# Patient Record
Sex: Female | Born: 1945 | Race: White | Hispanic: No | Marital: Single | Smoking: Never smoker
Health system: Southern US, Community
[De-identification: ages and names within clinical notes are randomized; demographics above are authoritative.]

## PROBLEM LIST (undated history)

## (undated) DIAGNOSIS — R519 Headache, unspecified: Secondary | ICD-10-CM

## (undated) DIAGNOSIS — R51 Headache: Secondary | ICD-10-CM

## (undated) DIAGNOSIS — K219 Gastro-esophageal reflux disease without esophagitis: Secondary | ICD-10-CM

## (undated) DIAGNOSIS — Z8719 Personal history of other diseases of the digestive system: Secondary | ICD-10-CM

## (undated) DIAGNOSIS — M1990 Unspecified osteoarthritis, unspecified site: Secondary | ICD-10-CM

## (undated) HISTORY — PX: TONSILLECTOMY: SUR1361

## (undated) HISTORY — PX: EYE SURGERY: SHX253

## (undated) HISTORY — PX: CHOLECYSTECTOMY: SHX55

---

## 2011-02-18 LAB — HM MAMMOGRAPHY

## 2011-05-09 ENCOUNTER — Encounter: Payer: Self-pay | Admitting: Family Medicine

## 2014-06-02 ENCOUNTER — Other Ambulatory Visit (INDEPENDENT_AMBULATORY_CARE_PROVIDER_SITE_OTHER): Payer: Self-pay | Admitting: Otolaryngology

## 2014-06-02 DIAGNOSIS — J329 Chronic sinusitis, unspecified: Secondary | ICD-10-CM

## 2014-06-09 ENCOUNTER — Ambulatory Visit
Admission: RE | Admit: 2014-06-09 | Discharge: 2014-06-09 | Disposition: A | Discharge status: Home / Self Care | Payer: Medicare Other | Source: Ambulatory Visit | Attending: Otolaryngology | Admitting: Otolaryngology

## 2014-06-09 DIAGNOSIS — J329 Chronic sinusitis, unspecified: Secondary | ICD-10-CM

## 2015-03-20 ENCOUNTER — Other Ambulatory Visit: Payer: Self-pay | Admitting: Otolaryngology

## 2015-04-18 ENCOUNTER — Encounter (HOSPITAL_BASED_OUTPATIENT_CLINIC_OR_DEPARTMENT_OTHER): Payer: Self-pay

## 2015-04-23 ENCOUNTER — Ambulatory Visit (HOSPITAL_BASED_OUTPATIENT_CLINIC_OR_DEPARTMENT_OTHER): Payer: Medicare Other | Admitting: Anesthesiology

## 2015-04-23 ENCOUNTER — Encounter (HOSPITAL_BASED_OUTPATIENT_CLINIC_OR_DEPARTMENT_OTHER)
Admission: RE | Disposition: A | Discharge status: Home / Self Care | Payer: Self-pay | Source: Ambulatory Visit | Attending: Otolaryngology

## 2015-04-23 ENCOUNTER — Encounter (HOSPITAL_BASED_OUTPATIENT_CLINIC_OR_DEPARTMENT_OTHER): Payer: Self-pay

## 2015-04-23 ENCOUNTER — Ambulatory Visit (HOSPITAL_BASED_OUTPATIENT_CLINIC_OR_DEPARTMENT_OTHER)
Admission: RE | Admit: 2015-04-23 | Discharge: 2015-04-23 | Disposition: A | Discharge status: Home / Self Care | Payer: Medicare Other | Source: Ambulatory Visit | Attending: Otolaryngology | Admitting: Otolaryngology

## 2015-04-23 DIAGNOSIS — J3489 Other specified disorders of nose and nasal sinuses: Secondary | ICD-10-CM | POA: Diagnosis present

## 2015-04-23 DIAGNOSIS — J342 Deviated nasal septum: Secondary | ICD-10-CM | POA: Diagnosis not present

## 2015-04-23 DIAGNOSIS — J343 Hypertrophy of nasal turbinates: Secondary | ICD-10-CM | POA: Diagnosis not present

## 2015-04-23 DIAGNOSIS — K219 Gastro-esophageal reflux disease without esophagitis: Secondary | ICD-10-CM | POA: Insufficient documentation

## 2015-04-23 HISTORY — DX: Gastro-esophageal reflux disease without esophagitis: K21.9

## 2015-04-23 HISTORY — DX: Headache, unspecified: R51.9

## 2015-04-23 HISTORY — DX: Personal history of other diseases of the digestive system: Z87.19

## 2015-04-23 HISTORY — PX: NASAL SEPTOPLASTY W/ TURBINOPLASTY: SHX2070

## 2015-04-23 HISTORY — DX: Headache: R51

## 2015-04-23 HISTORY — DX: Unspecified osteoarthritis, unspecified site: M19.90

## 2015-04-23 LAB — POCT HEMOGLOBIN-HEMACUE: Hemoglobin: 15.6 g/dL — ABNORMAL HIGH (ref 12.0–15.0)

## 2015-04-23 SURGERY — SEPTOPLASTY, NOSE, WITH NASAL TURBINATE REDUCTION
Anesthesia: General | Site: Nose | Laterality: Bilateral

## 2015-04-23 MED ORDER — FENTANYL CITRATE (PF) 100 MCG/2ML IJ SOLN
INTRAMUSCULAR | Status: AC
  Filled 2015-04-23: qty 2

## 2015-04-23 MED ORDER — FENTANYL CITRATE (PF) 100 MCG/2ML IJ SOLN
INTRAMUSCULAR | Status: AC
  Filled 2015-04-23: qty 6

## 2015-04-23 MED ORDER — SUCCINYLCHOLINE CHLORIDE 20 MG/ML IJ SOLN
INTRAMUSCULAR | Status: DC | PRN
  Administered 2015-04-23: 100 mg via INTRAVENOUS

## 2015-04-23 MED ORDER — GLYCOPYRROLATE 0.2 MG/ML IJ SOLN: 0.2000 mg | Freq: Once | INTRAMUSCULAR | Status: DC | PRN

## 2015-04-23 MED ORDER — FENTANYL CITRATE (PF) 100 MCG/2ML IJ SOLN
25.0000 ug | INTRAMUSCULAR | Status: DC | PRN
Start: 2015-04-23 — End: 2015-04-23
  Administered 2015-04-23 (×2): 25 ug via INTRAVENOUS

## 2015-04-23 MED ORDER — AMOXICILLIN 875 MG PO TABS: 875.0000 mg | ORAL_TABLET | Freq: Two times a day (BID) | ORAL | Status: DC

## 2015-04-23 MED ORDER — FENTANYL CITRATE (PF) 100 MCG/2ML IJ SOLN
50.0000 ug | INTRAMUSCULAR | Status: DC | PRN
  Administered 2015-04-23: 100 ug via INTRAVENOUS

## 2015-04-23 MED ORDER — OXYCODONE-ACETAMINOPHEN 5-325 MG PO TABS: 1.0000 | ORAL_TABLET | ORAL | Status: DC | PRN

## 2015-04-23 MED ORDER — EPHEDRINE SULFATE 50 MG/ML IJ SOLN
INTRAMUSCULAR | Status: DC | PRN
  Administered 2015-04-23: 10 mg via INTRAVENOUS

## 2015-04-23 MED ORDER — OXYMETAZOLINE HCL 0.05 % NA SOLN
NASAL | Status: DC | PRN
  Administered 2015-04-23: 1

## 2015-04-23 MED ORDER — MIDAZOLAM HCL 2 MG/2ML IJ SOLN
1.0000 mg | INTRAMUSCULAR | Status: DC | PRN
  Administered 2015-04-23: 2 mg via INTRAVENOUS

## 2015-04-23 MED ORDER — LIDOCAINE HCL (CARDIAC) 20 MG/ML IV SOLN
INTRAVENOUS | Status: DC | PRN
  Administered 2015-04-23: 70 mg via INTRAVENOUS

## 2015-04-23 MED ORDER — LACTATED RINGERS IV SOLN
INTRAVENOUS | Status: DC
  Administered 2015-04-23 (×2): via INTRAVENOUS

## 2015-04-23 MED ORDER — DEXAMETHASONE SODIUM PHOSPHATE 4 MG/ML IJ SOLN
INTRAMUSCULAR | Status: DC | PRN
  Administered 2015-04-23: 10 mg via INTRAVENOUS

## 2015-04-23 MED ORDER — PROPOFOL 10 MG/ML IV BOLUS
INTRAVENOUS | Status: DC | PRN
  Administered 2015-04-23: 160 mg via INTRAVENOUS

## 2015-04-23 MED ORDER — MIDAZOLAM HCL 2 MG/2ML IJ SOLN
INTRAMUSCULAR | Status: AC
  Filled 2015-04-23: qty 2

## 2015-04-23 MED ORDER — SCOPOLAMINE 1 MG/3DAYS TD PT72: 1.0000 | MEDICATED_PATCH | Freq: Once | TRANSDERMAL | Status: DC | PRN

## 2015-04-23 MED ORDER — MUPIROCIN 2 % EX OINT
TOPICAL_OINTMENT | CUTANEOUS | Status: DC | PRN
  Administered 2015-04-23: 1 via NASAL

## 2015-04-23 MED ORDER — ONDANSETRON HCL 4 MG/2ML IJ SOLN
INTRAMUSCULAR | Status: DC | PRN
  Administered 2015-04-23: 4 mg via INTRAVENOUS

## 2015-04-23 MED ORDER — LIDOCAINE-EPINEPHRINE 1 %-1:100000 IJ SOLN
INTRAMUSCULAR | Status: DC | PRN
  Administered 2015-04-23: 4 mL

## 2015-04-23 MED ORDER — CEFAZOLIN SODIUM-DEXTROSE 2-3 GM-% IV SOLR
INTRAVENOUS | Status: AC
  Filled 2015-04-23: qty 50

## 2015-04-23 MED ORDER — CEFAZOLIN SODIUM-DEXTROSE 2-3 GM-% IV SOLR
INTRAVENOUS | Status: DC | PRN
  Administered 2015-04-23: 2 g via INTRAVENOUS

## 2015-04-23 SURGICAL SUPPLY — 35 items
ATTRACTOMAT 16X20 MAGNETIC DRP (DRAPES) IMPLANT
CANISTER SUCT 1200ML W/VALVE (MISCELLANEOUS) ×3 IMPLANT
COAGULATOR SUCT 8FR VV (MISCELLANEOUS) ×3 IMPLANT
DECANTER SPIKE VIAL GLASS SM (MISCELLANEOUS) IMPLANT
DRSG NASOPORE 8CM (GAUZE/BANDAGES/DRESSINGS) IMPLANT
DRSG TELFA 3X8 NADH (GAUZE/BANDAGES/DRESSINGS) IMPLANT
ELECT REM PT RETURN 9FT ADLT (ELECTROSURGICAL) ×3
ELECTRODE REM PT RTRN 9FT ADLT (ELECTROSURGICAL) ×1 IMPLANT
GLOVE BIO SURGEON STRL SZ7.5 (GLOVE) ×3 IMPLANT
GLOVE BIOGEL PI IND STRL 7.0 (GLOVE) IMPLANT
GLOVE BIOGEL PI INDICATOR 7.0 (GLOVE) ×2
GLOVE ECLIPSE 6.5 STRL STRAW (GLOVE) ×2 IMPLANT
GOWN STRL REUS W/ TWL LRG LVL3 (GOWN DISPOSABLE) ×2 IMPLANT
GOWN STRL REUS W/TWL LRG LVL3 (GOWN DISPOSABLE) ×6
NDL HYPO 25X1 1.5 SAFETY (NEEDLE) ×1 IMPLANT
NEEDLE HYPO 25X1 1.5 SAFETY (NEEDLE) ×3 IMPLANT
NS IRRIG 1000ML POUR BTL (IV SOLUTION) ×3 IMPLANT
PACK BASIN DAY SURGERY FS (CUSTOM PROCEDURE TRAY) ×3 IMPLANT
PACK ENT DAY SURGERY (CUSTOM PROCEDURE TRAY) ×3 IMPLANT
PAD DRESSING TELFA 3X8 NADH (GAUZE/BANDAGES/DRESSINGS) IMPLANT
SLEEVE SCD COMPRESS KNEE MED (MISCELLANEOUS) IMPLANT
SOLUTION BUTLER CLEAR DIP (MISCELLANEOUS) ×3 IMPLANT
SPLINT NASAL AIRWAY SILICONE (MISCELLANEOUS) IMPLANT
SPONGE GAUZE 2X2 8PLY STER LF (GAUZE/BANDAGES/DRESSINGS) ×1
SPONGE GAUZE 2X2 8PLY STRL LF (GAUZE/BANDAGES/DRESSINGS) ×2 IMPLANT
SPONGE NEURO XRAY DETECT 1X3 (DISPOSABLE) ×3 IMPLANT
SUT CHROMIC 4 0 P 3 18 (SUTURE) ×3 IMPLANT
SUT PLAIN 4 0 ~~LOC~~ 1 (SUTURE) ×3 IMPLANT
SUT PROLENE 3 0 PS 2 (SUTURE) ×3 IMPLANT
SUT VIC AB 4-0 P-3 18XBRD (SUTURE) IMPLANT
SUT VIC AB 4-0 P3 18 (SUTURE)
TOWEL OR 17X24 6PK STRL BLUE (TOWEL DISPOSABLE) ×3 IMPLANT
TUBE SALEM SUMP 12R W/ARV (TUBING) IMPLANT
TUBE SALEM SUMP 16 FR W/ARV (TUBING) ×3 IMPLANT
YANKAUER SUCT BULB TIP NO VENT (SUCTIONS) ×3 IMPLANT

## 2015-04-23 NOTE — Transfer of Care (Signed)
Immediate Anesthesia Transfer of Care Note  Patient: Kayla Peters  Procedure(s) Performed: Procedure(s): NASAL SEPTOPLASTY WITH BILATERAL TURBINATE REDUCTION (Bilateral)  Patient Location: PACU  Anesthesia Type:General  Level of Consciousness: awake, alert  and oriented  Airway & Oxygen Therapy: Patient Spontanous Breathing and Patient connected to face mask oxygen  Post-op Assessment: Report given to RN and Post -op Vital signs reviewed and stable  Post vital signs: Reviewed and stable  Last Vitals:  Filed Vitals:   04/23/15 0804  BP: 155/80  Pulse: 90  Temp: 36.4 C  Resp: 18    Complications: No apparent anesthesia complications

## 2015-04-23 NOTE — H&P (Signed)
Cc: Chronic nasal obstruction  HPI: The patient is a 69 year old female who presents today for her follow-up evaluation. She was last seen 3 months ago. At that time, she was noted to have bilateral nasal mucosal congestion, septal deviation, and bilateral inferior turbinate hypertrophy. Her previous sinus CT was consistent with bilateral chronic maxillary sinusitis. The patient was encouraged to continue to perform nasal saline irrigation on a daily basis and continue the use of a steroid nasal spray. The patient returns today noting continued nasal congestion. She discontinued her steroid nasal spray secondary to headaches. She currently denies any facial pain or pressure. She denies any fever, visual change, or purulent drainage. No other ENT, GI, or respiratory issue noted since the last visit.   Exam General: Communicates without difficulty, well nourished, no acute distress. Head: Normocephalic, no evidence injury, no tenderness, facial buttresses intact without stepoff. Eyes: PERRL, EOMI. No scleral icterus, conjunctivae clear. Neuro: CN II exam reveals vision grossly intact. No nystagmus at any point of gaze. Ears: Auricles well formed without lesions. Ear canals are intact without mass or lesion. No erythema or edema is appreciated. The TMs are intact without fluid. Nose: External evaluation reveals normal support and skin without lesions. Dorsum is intact. Anterior rhinoscopy reveals congested and edematous mucosa over anterior aspect of the inferior turbinates and nasal septum. No purulence is noted. NSD. Oral:  Oral cavity and oropharynx are intact, symmetric, without erythema or edema. Mucosa is moist without lesions. Neck: Full range of motion without pain. There is no significant lymphadenopathy. No masses palpable. Thyroid bed within normal limits to palpation. Parotid glands and submandibular glands equal bilaterally without mass. Trachea is midline. Neuro:  CN 2-12 grossly intact. Gait  normal.  Assessment 1. Persistent nasal mucosal congestion with bilateral inferior turbinate hypertrophy and nasal septal deviation without evidence of acute sinus infection.  Plan  1. The physical exam findings and treatment options are again discussed with the patient. 2. The option of septoplasty and turbinate reduction to improve her nasal passageway is extensively discussed. The risks, benefits and details of the procedure are also reviewed. The patient would like to continue with medical management at this time. 3.  The patient would like to proceed with the procedures.

## 2015-04-23 NOTE — Op Note (Signed)
DATE OF PROCEDURE: 04/23/2015  OPERATIVE REPORT   SURGEON: Newman Pies, MD   PREOPERATIVE DIAGNOSES:  1. Severe nasal septal deviation.  2. Bilateral inferior turbinate hypertrophy.  3. Chronic nasal obstruction.  POSTOPERATIVE DIAGNOSES:  1. Severe nasal septal deviation.  2. Bilateral inferior turbinate hypertrophy.  3. Chronic nasal obstruction.  PROCEDURE PERFORMED:  1. Septoplasty.  2. Bilateral partial inferior turbinate resection.   ANESTHESIA: General endotracheal tube anesthesia.   COMPLICATIONS: None.   ESTIMATED BLOOD LOSS: Less than 50 mL.   INDICATION FOR PROCEDURE: Kayla Peters is a 69 y.o. female with a history of chronic nasal obstruction. The patient was  treated with  antihistamine, decongestant, steroid nasal spray, and systemic steroids. However, the patient continues to be symptomatic. On examination, the patient was noted to have bilateral severe inferior turbinate hypertrophy and significant nasal septal deviation, causing significant nasal obstruction. Based on the above findings, the decision was made for the patient to undergo the above-stated procedure. The risks, benefits, alternatives, and details of the procedure were discussed with the patient. Questions were invited and answered. Informed consent was obtained.   DESCRIPTION OF PROCEDURE: The patient was taken to the operating room and placed supine on the operating table. General endotracheal tube anesthesia was administered by the anesthesiologist. The patient was positioned, and prepped and draped in the standard fashion for nasal surgery. Pledgets soaked with Afrin were placed in both nasal cavities for decongestion. The pledgets were subsequently removed. The above mentioned severe septal deviation was again noted. 1% lidocaine with 1:100,000 epinephrine was injected onto the nasal septum bilaterally. A hemitransfixion incision was made on the left side. The mucosal flap was carefully elevated on the left  side. A cartilaginous incision was made 1 cm superior to the caudal margin of the nasal septum. Mucosal flap was also elevated on the right side in the similar fashion. It should be noted that due to the severe septal deviation, the deviated portion of the cartilaginous and bony septum had to be removed in piecemeal fashion. Once the deviated portions were removed, a straight midline septum was achieved. The septum was then quilted with 4-0 plain gut sutures. The hemitransfixion incision was closed with interrupted 4-0 chromic sutures. Doyle splints were applied.   Prior to the [facility] splint application, the inferior one half of both hypertrophied inferior turbinate was crossclamped with a Kelly clamp. The inferior one half of each inferior turbinate was then resected with a pair of cross cutting scissors. Hemostasis was achieved with a suction cautery device.   The care of the patient was turned over to the anesthesiologist. The patient was awakened from anesthesia without difficulty. The patient was extubated and transferred to the recovery room in good condition.   OPERATIVE FINDINGS: Severe nasal septal deviation and bilateral inferior turbinate hypertrophy.   SPECIMEN: None.   FOLLOWUP CARE: The patient be discharged home once she is awake and alert. The patient will be placed on Percocet 1-2 tablets p.o. q.6 hours p.r.n. pain, and amoxicillin 875 mg p.o. b.i.d. for 5 days. The patient will follow up in my office in approximately 1 week for splint removal.   Dashonda Bonneau Philomena Doheny, MD

## 2015-04-23 NOTE — Anesthesia Procedure Notes (Signed)
Procedure Name: Intubation Date/Time: 04/23/2015 9:42 AM Performed by: Burna CashONRAD, Kani Chauvin C Pre-anesthesia Checklist: Patient identified, Emergency Drugs available, Suction available and Patient being monitored Patient Re-evaluated:Patient Re-evaluated prior to inductionOxygen Delivery Method: Circle System Utilized Preoxygenation: Pre-oxygenation with 100% oxygen Intubation Type: IV induction Ventilation: Mask ventilation without difficulty Laryngoscope Size: Mac and 3 Grade View: Grade I Tube type: Oral Tube size: 7.0 mm Number of attempts: 1 Airway Equipment and Method: Stylet and Oral airway Placement Confirmation: ETT inserted through vocal cords under direct vision,  positive ETCO2 and breath sounds checked- equal and bilateral Secured at: 21 cm Tube secured with: Tape Dental Injury: Teeth and Oropharynx as per pre-operative assessment

## 2015-04-23 NOTE — Anesthesia Postprocedure Evaluation (Signed)
  Anesthesia Post-op Note  Patient: Kayla Peters  Procedure(s) Performed: Procedure(s): NASAL SEPTOPLASTY WITH BILATERAL TURBINATE REDUCTION (Bilateral)  Patient Location: PACU  Anesthesia Type:General  Level of Consciousness: awake  Airway and Oxygen Therapy: Patient Spontanous Breathing  Post-op Pain: mild  Post-op Assessment: Post-op Vital signs reviewed              Post-op Vital Signs: Reviewed  Last Vitals:  Filed Vitals:   04/23/15 1212  BP:   Pulse: 83  Temp: 36.5 C  Resp: 18    Complications: No apparent anesthesia complications

## 2015-04-23 NOTE — Anesthesia Preprocedure Evaluation (Addendum)
Anesthesia Evaluation  Patient identified by MRN, date of birth, ID band Patient awake    Airway Mallampati: II  TM Distance: >3 FB Neck ROM: Full    Dental   Pulmonary neg pulmonary ROS,  breath sounds clear to auscultation        Cardiovascular negative cardio ROS  Rhythm:Regular Rate:Normal     Neuro/Psych    GI/Hepatic Neg liver ROS, hiatal hernia, GERD-  ,  Endo/Other    Renal/GU negative Renal ROS     Musculoskeletal   Abdominal   Peds  Hematology   Anesthesia Other Findings   Reproductive/Obstetrics                            Anesthesia Physical Anesthesia Plan  ASA: II  Anesthesia Plan: General   Post-op Pain Management:    Induction: Intravenous  Airway Management Planned: Oral ETT  Additional Equipment:   Intra-op Plan:   Post-operative Plan: Extubation in OR  Informed Consent: I have reviewed the patients History and Physical, chart, labs and discussed the procedure including the risks, benefits and alternatives for the proposed anesthesia with the patient or authorized representative who has indicated his/her understanding and acceptance.   Dental advisory given  Plan Discussed with: CRNA and Anesthesiologist  Anesthesia Plan Comments:         Anesthesia Quick Evaluation

## 2015-04-23 NOTE — Discharge Instructions (Addendum)

## 2015-04-24 ENCOUNTER — Encounter (HOSPITAL_BASED_OUTPATIENT_CLINIC_OR_DEPARTMENT_OTHER): Payer: Self-pay | Admitting: Otolaryngology

## 2019-11-16 ENCOUNTER — Ambulatory Visit: Payer: Medicare Other | Attending: Internal Medicine

## 2019-11-16 DIAGNOSIS — Z23 Encounter for immunization: Secondary | ICD-10-CM

## 2019-11-16 NOTE — Progress Notes (Signed)
   Covid-19 Vaccination Clinic  Name:  Kayla Peters    MRN: [id number] DOB: 10/15/1946  11/16/2019  [name] was observed post Covid-19 immunization for 15 minutes without incidence. She was provided with Vaccine Information Sheet and instruction to access the V-Safe system.   [name] was instructed to call 911 with any severe reactions post vaccine: Marland Kitchen Difficulty breathing  . Swelling of your face and throat  . A fast heartbeat  . A bad rash all over your body  . Dizziness and weakness    Immunizations Administered    Name Date Dose VIS Date Route   Pfizer COVID-19 Vaccine 11/16/2019 12:40 PM 0.3 mL 10/07/2019 Intramuscular   Manufacturer: [employer], [employer]   Lot: [id number]   NDC: [id number]

## 2019-12-05 ENCOUNTER — Ambulatory Visit: Payer: Medicare Other | Attending: Internal Medicine

## 2019-12-05 DIAGNOSIS — Z23 Encounter for immunization: Secondary | ICD-10-CM | POA: Insufficient documentation

## 2019-12-05 NOTE — Progress Notes (Signed)
   Covid-19 Vaccination Clinic  Name:  Shaden Higley    MRN: [id number] DOB: 22-May-1946  12/05/2019  Ms. Courtright was observed post Covid-19 immunization for 15 minutes without incidence. She was provided with Vaccine Information Sheet and instruction to access the V-Safe system.   Ms. Poppen was instructed to call 911 with any severe reactions post vaccine: Marland Kitchen Difficulty breathing  . Swelling of your face and throat  . A fast heartbeat  . A bad rash all over your body  . Dizziness and weakness    Immunizations Administered    Name Date Dose VIS Date Route   Pfizer COVID-19 Vaccine 12/05/2019  9:03 AM 0.3 mL 10/07/2019 Intramuscular   Manufacturer: [employer], [employer]   Lot: [id number]   NDC: [address]-8

## 2019-12-23 ENCOUNTER — Ambulatory Visit: Payer: Medicare Other

## 2020-01-02 ENCOUNTER — Ambulatory Visit (INDEPENDENT_AMBULATORY_CARE_PROVIDER_SITE_OTHER): Payer: Medicare Other | Admitting: Sports Medicine

## 2020-01-02 ENCOUNTER — Other Ambulatory Visit: Payer: Self-pay

## 2020-01-02 ENCOUNTER — Ambulatory Visit (INDEPENDENT_AMBULATORY_CARE_PROVIDER_SITE_OTHER): Payer: Medicare Other

## 2020-01-02 ENCOUNTER — Encounter: Payer: Self-pay | Admitting: Sports Medicine

## 2020-01-02 DIAGNOSIS — M25572 Pain in left ankle and joints of left foot: Secondary | ICD-10-CM

## 2020-01-02 DIAGNOSIS — G8929 Other chronic pain: Secondary | ICD-10-CM | POA: Insufficient documentation

## 2020-01-02 DIAGNOSIS — L84 Corns and callosities: Secondary | ICD-10-CM | POA: Diagnosis not present

## 2020-01-02 DIAGNOSIS — L600 Ingrowing nail: Secondary | ICD-10-CM | POA: Diagnosis not present

## 2020-01-02 MED ORDER — CELECOXIB 100 MG PO CAPS: ORAL_CAPSULE | ORAL | 6 refills | Status: AC

## 2020-01-02 MED ORDER — DOXYCYCLINE HYCLATE 100 MG PO TABS: 100.0000 mg | ORAL_TABLET | Freq: Two times a day (BID) | ORAL | 0 refills | Status: AC

## 2020-01-02 NOTE — Progress Notes (Addendum)
    Procedures performed today:    None.  Independent interpretation of notes and tests performed by another provider:   None.  Impression and Recommendations:    Corn of left foot Kayla Peters does have 2 corns on the plantar aspect of her left fifth MTP. The proximal corn is the most painful. At a future visit she will come back for full surgical excision, she has had a few shaving procedures with podiatry without much improvement.  Chronic pain of left ankle For years this pleasant 74 year old female has had pain all around her talar dome, left ankle, moderate, persistent. 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  Ingrown toenail of right foot Kayla Peters had a medial nail plate excision, lateral nail plate excision sometime ago with a podiatrist. Unfortunately she has had chronic persistent pain with mild drainage, erythema. On exam there is a shard of nail that seems to be digging into her medial nail fold. It has also developed into a paronychia. Adding doxycycline, Celebrex. She will return on Friday for medial and lateral nail plate excision's with phenol matricectomy.    ___________________________________________ Ihor Austin. Benjamin Stain, M.D., ABFM., CAQSM. Primary Care and Sports Medicine [facility]  Adjunct Instructor of Family Medicine  [employer]

## 2020-01-02 NOTE — Assessment & Plan Note (Signed)
For years this pleasant 74 year old female has had pain all around her talar dome, left ankle, moderate, persistent. 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

## 2020-01-02 NOTE — Assessment & Plan Note (Addendum)
Kayla Peters does have 2 corns on the plantar aspect of her left fifth MTP. The proximal corn is the most painful. At a future visit she will come back for full surgical excision, she has had a few shaving procedures with podiatry without much improvement.

## 2020-01-02 NOTE — Assessment & Plan Note (Addendum)
Kayla Peters had a medial nail plate excision, lateral nail plate excision sometime ago with a podiatrist. Unfortunately she has had chronic persistent pain with mild drainage, erythema. On exam there is a shard of nail that seems to be digging into her medial nail fold. It has also developed into a paronychia. Adding doxycycline, Celebrex. She will return on Friday for medial and lateral nail plate excision's with phenol matricectomy.

## 2020-01-06 ENCOUNTER — Other Ambulatory Visit: Payer: Self-pay

## 2020-01-06 ENCOUNTER — Ambulatory Visit (INDEPENDENT_AMBULATORY_CARE_PROVIDER_SITE_OTHER): Payer: Medicare Other | Admitting: Sports Medicine

## 2020-01-06 DIAGNOSIS — L600 Ingrowing nail: Secondary | ICD-10-CM | POA: Diagnosis not present

## 2020-01-06 MED ORDER — TERBINAFINE HCL 250 MG PO TABS: 250.0000 mg | ORAL_TABLET | Freq: Every day | ORAL | 1 refills | Status: AC

## 2020-01-06 MED ORDER — HYDROCODONE-ACETAMINOPHEN 10-325 MG PO TABS: 1.0000 | ORAL_TABLET | Freq: Three times a day (TID) | ORAL | 0 refills | Status: DC | PRN

## 2020-01-06 NOTE — Assessment & Plan Note (Signed)
Kayla Peters returns, she had a medial nail plate excision last year sometime, unfortunately a shard of nail seems to be digging into her lateral nail fold of her right great toe. Today we are going to do a lateral nail plate excision with phenol matricectomy. I am also going to add terbinafine for 6 months with LFTs checked after 1 month. She did finish her course of doxycycline. Return to see me in 1 month for a wound check. She is going to think about what kind of pain medication she would like to have after the surgery.

## 2020-01-06 NOTE — Addendum Note (Signed)
Addended by: Monica Becton on: 01/06/2020 02:53 PM   Modules accepted: Orders

## 2020-01-06 NOTE — Progress Notes (Signed)
     Procedures performed today:    Procedure:  Removal of right great lateral nail plate with phenol matricectomy. Risks, benefits, alternatives explained to patient. Consent obtained. Time out conducted. Noted no overlying induration or erythema at site of injection. Toe cleaned with alcohol, then a total of 10cc lidocaine 1% infiltrated at adjacent webspaces at the location of the bifurcation of the common digital nerve to proper digital nerves.  Adequate anesthesia ensured. Toe prepped and draped in a sterile fashion. Nail elevator used to separate nail plate from nail bed. Clippers used to cut toenail in a longitudinal fashion to proximal nail fold and matrix. Hemostat then used to separate nail fragment from surrounding structures. Nail bed and matrix treated. Minor bleeding controlled with pressure and phenol. Antibiotic ointment applied. Toe dressed. Advised to return if increased redness, swelling, drainage, fevers, or chills.  Independent interpretation of notes and tests performed by another provider:   None.  Impression and Recommendations:    Ingrown toenail of right foot Kayla Peters returns, she had a medial nail plate excision last year sometime, unfortunately a shard of nail seems to be digging into her lateral nail fold of her right great toe. Today we are going to do a lateral nail plate excision with phenol matricectomy. I am also going to add terbinafine for 6 months with LFTs checked after 1 month. She did finish her course of doxycycline. Return to see me in 1 month for a wound check. She is going to think about what kind of pain medication she would like to have after the surgery.    ___________________________________________ Ihor Austin. Benjamin Stain, M.D., ABFM., CAQSM. Primary Care and Sports Medicine [facility]  Adjunct Instructor of Family Medicine  [employer]

## 2020-01-06 NOTE — Patient Instructions (Signed)
Fingernail or Toenail Removal, Adult, Care After °This sheet gives you information about how to care for yourself after your procedure. Your health care provider may also give you more specific instructions. If you have problems or questions, contact your health care provider. °What can I expect after the procedure? °After the procedure, it is common to have: °· Pain. °· Redness. °· Swelling. °· Soreness. °Follow these instructions at home: °Medicines °· Take over-the-counter and prescription medicines only as told by your health care provider. °· If you were prescribed an antibiotic medicine, take or apply it as told by your health care provider. Do not stop using the antibiotic even if you start to feel better. °Wound care °· Follow instructions from your health care provider about how to take care of your wound. Make sure you: °? Wash your hands with soap and water for at least 20 seconds before and after you change your bandage (dressing). If soap and water are not available, use hand sanitizer. °? Change your dressing as told by your health care provider. °? Keep your dressing dry until your health care provider says it can be removed. °· Check your wound every day for signs of infection. Check for: °? More redness, swelling, or pain. °? More fluid or blood. °? Warmth. °? Pus or a bad smell. °If you have a splint: ° °· Wear the splint as told by your health care provider. Remove it only as told by your health care provider. °· Loosen the splint if your fingers tingle, become numb, or turn cold and blue. °· Keep the splint clean. °· If the splint is not waterproof: °? Do not let it get wet. °? Cover it with a watertight covering when you take a bath or a shower. °Managing pain, stiffness, and swelling °· Move your fingers or toes often to reduce stiffness and swelling. °· Raise (elevate) the injured area above the level of your heart while you are sitting or lying down. You may need to keep your hand or foot  raised or supported on a pillow for 24 hours or as told by your health care provider. °General instructions °· If you were given a shoe to wear, wear it as told by your health care provider. °· Keep all follow-up visits as told by your health care provider. This is important. °Contact a health care provider if: °· You have more redness, swelling, or pain around your wound. °· You have more fluid or blood coming from your wound. °· You have pus or a bad smell coming from your wound. °· Your wound feels warm to the touch. °· You have a fever. °Get help right away if: °· Your finger or toe looks pale, blue, or black. °· You are not able to move your finger or toe. °Summary °· After the procedure, it is common to have pain and swelling. °· Keep the hand or foot raised (elevated) or supported on a pillow as told by your health care provider. °· Take over-the-counter and prescription medicines only as told by your health care provider. °· Check your wound every day for signs of infection. °This information is not intended to replace advice given to you by your health care provider. Make sure you discuss any questions you have with your health care provider. °Document Revised: 06/06/2019 Document Reviewed: 06/06/2019 °Elsevier Patient Education © 2020 [employer] Inc[employer] ° °

## 2020-02-03 ENCOUNTER — Ambulatory Visit (INDEPENDENT_AMBULATORY_CARE_PROVIDER_SITE_OTHER): Payer: Medicare Other | Admitting: Sports Medicine

## 2020-02-03 ENCOUNTER — Other Ambulatory Visit: Payer: Self-pay

## 2020-02-03 DIAGNOSIS — L84 Corns and callosities: Secondary | ICD-10-CM | POA: Diagnosis not present

## 2020-02-03 DIAGNOSIS — L600 Ingrowing nail: Secondary | ICD-10-CM

## 2020-02-03 NOTE — Progress Notes (Signed)
    Procedures performed today:    None.  Independent interpretation of notes and tests performed by another provider:   None.  Brief History, Exam, Impression, and Recommendations:    Ingrown toenail of right foot This pleasant 74 year old female returns, 1 month ago I performed a right great lateral nail plate excision. She is doing extremely well.  Corn of left foot Normal does have a couple of corns on the plantar aspect of her left fifth MTP and proximally over the fifth metatarsal. The proximal corn is the most painful, she will make an appointment for a 30-minute full surgical excision, she declines any sedation prior.     ___________________________________________ Ihor Austin. Benjamin Stain, M.D., ABFM., CAQSM. Primary Care and Sports Medicine [facility]  Adjunct Instructor of Family Medicine  [employer]

## 2020-02-03 NOTE — Assessment & Plan Note (Signed)
Normal does have a couple of corns on the plantar aspect of her left fifth MTP and proximally over the fifth metatarsal. The proximal corn is the most painful, she will make an appointment for a 30-minute full surgical excision, she declines any sedation prior.

## 2020-02-03 NOTE — Assessment & Plan Note (Signed)
This pleasant 74 year old female returns, 1 month ago I performed a right great lateral nail plate excision. She is doing extremely well.

## 2020-02-08 ENCOUNTER — Encounter: Payer: Self-pay | Admitting: Sports Medicine

## 2020-02-08 ENCOUNTER — Ambulatory Visit (INDEPENDENT_AMBULATORY_CARE_PROVIDER_SITE_OTHER): Payer: Medicare Other | Admitting: Sports Medicine

## 2020-02-08 ENCOUNTER — Other Ambulatory Visit: Payer: Self-pay

## 2020-02-08 DIAGNOSIS — L84 Corns and callosities: Secondary | ICD-10-CM

## 2020-02-08 NOTE — Assessment & Plan Note (Signed)
Kayla Peters returns, she has 2 corns on the plantar aspect of her left foot, around the MTP. I excised both of these today under local anesthesia. They were both closed with interrupted sutures, she will return to see me in 2 weeks for suture removal. She will wear a postop shoe until then. She does have oxycodone at home for pain.

## 2020-02-08 NOTE — Patient Instructions (Signed)
Incision Care, Adult An incision is a surgical cut that is made through your skin. Most incisions are closed after surgery. Your incision may be closed with stitches (sutures), staples, skin glue, or adhesive strips. You may need to return to your health care provider to have sutures or staples removed. This may occur several days to several weeks after your surgery. The incision needs to be cared for properly to prevent infection. How to care for your incision Incision care   Follow instructions from your health care provider about how to take care of your incision. Make sure you: ? Wash your hands with soap and water before you change the bandage (dressing). If soap and water are not available, use hand sanitizer. ? Change your dressing as told by your health care provider. ? Leave sutures, skin glue, or adhesive strips in place. These skin closures may need to stay in place for 2 weeks or longer. If adhesive strip edges start to loosen and curl up, you may trim the loose edges. Do not remove adhesive strips completely unless your health care provider tells you to do that.  Check your incision area every day for signs of infection. Check for: ? More redness, swelling, or pain. ? More fluid or blood. ? Warmth. ? Pus or a bad smell.  Ask your health care provider how to clean the incision. This may include: ? Using mild soap and water. ? Using a clean towel to pat the incision dry after cleaning it. ? Applying a cream or ointment. Do this only as told by your health care provider. ? Covering the incision with a clean dressing.  Ask your health care provider when you can leave the incision uncovered.  Do not take baths, swim, or use a hot tub until your health care provider approves. Ask your health care provider if you can take showers. You may only be allowed to take sponge baths for bathing. Medicines  If you were prescribed an antibiotic medicine, cream, or ointment, take or apply the  antibiotic as told by your health care provider. Do not stop taking or applying the antibiotic even if your condition improves.  Take over-the-counter and prescription medicines only as told by your health care provider. General instructions  Limit movement around your incision to improve healing. ? Avoid straining, lifting, or exercise for the first month, or for as long as told by your health care provider. ? Follow instructions from your health care provider about returning to your normal activities. ? Ask your health care provider what activities are safe.  Protect your incision from the sun when you are outside for the first 6 months, or for as long as told by your health care provider. Apply sunscreen around the scar or cover it up.  Keep all follow-up visits as told by your health care provider. This is important. Contact a health care provider if:  Your have more redness, swelling, or pain around the incision.  You have more fluid or blood coming from the incision.  Your incision feels warm to the touch.  You have pus or a bad smell coming from the incision.  You have a fever or shaking chills.  You are nauseous or you vomit.  You are dizzy.  Your sutures or staples come undone. Get help right away if:  You have a red streak coming from your incision.  Your incision bleeds through the dressing and the bleeding does not stop with gentle pressure.  The edges of   your incision open up and separate.  You have severe pain.  You have a rash.  You are confused.  You faint.  You have trouble breathing and a fast heartbeat. This information is not intended to replace advice given to you by your health care provider. Make sure you discuss any questions you have with your health care provider. Document Revised: 10/15/2018 Document Reviewed: 04/30/2016 Elsevier Patient Education  2020 [employer]   

## 2020-02-08 NOTE — Progress Notes (Signed)
    Procedures performed today:    Procedure:  Excision of first 1.1 cm corn on the plantar foot at the level of the fifth MTP Risks, benefits, and alternatives explained and consent obtained. Time out conducted. Surface prepped with alcohol. 1cc lidocaine with epinephine infiltrated in a field block. Adequate anesthesia ensured. Area prepped and draped in a sterile fashion. Excision performed with: I made an elliptical incision with a #11 blade, undermined the incision using blunt dissection to remove tension across the edges, removed the corn in its entirety and closed the incision with #3 Ethicon 3-0 silk sutures. Hemostasis achieved. Pt stable.  Procedure:  Excision of second 1.1 cm corn on the plantar foot at the level of the fifth MTP Risks, benefits, and alternatives explained and consent obtained. Time out conducted. Surface prepped with alcohol. 1cc lidocaine with epinephine infiltrated in a field block. Adequate anesthesia ensured. Area prepped and draped in a sterile fashion. Excision performed with: I made an elliptical incision with a #11 blade, undermined the incision, undermined the edges with blunt dissection to remove tension across the incision, and removed the corn in its entirety and closed the incision with #2 Ethicon 3-0 silk sutures. Hemostasis achieved. Pt stable.  Independent interpretation of notes and tests performed by another provider:   None.  Brief History, Exam, Impression, and Recommendations:    Corn of left foot Kayla Peters returns, she has 2 corns on the plantar aspect of her left foot, around the MTP. I excised both of these today under local anesthesia. They were both closed with interrupted sutures, she will return to see me in 2 weeks for suture removal. She will wear a postop shoe until then. She does have oxycodone at home for pain.    ___________________________________________ Ihor Austin. Benjamin Stain, M.D., ABFM., CAQSM. Primary Care and  Sports Medicine [place] [facility]  Adjunct Instructor of Family Medicine  [employer]

## 2020-02-22 ENCOUNTER — Other Ambulatory Visit: Payer: Self-pay

## 2020-02-22 ENCOUNTER — Ambulatory Visit (INDEPENDENT_AMBULATORY_CARE_PROVIDER_SITE_OTHER): Payer: Medicare Other | Admitting: Sports Medicine

## 2020-02-22 DIAGNOSIS — L84 Corns and callosities: Secondary | ICD-10-CM | POA: Diagnosis not present

## 2020-02-22 DIAGNOSIS — L600 Ingrowing nail: Secondary | ICD-10-CM

## 2020-02-22 NOTE — Assessment & Plan Note (Addendum)
Kayla Peters also had a right lateral great nail plate excision with phenol matricectomy, she continues to do extremely well, her nail looks great.  This was approximately 6 weeks ago. She is currently using Jublia, I think she can do this for another month but no further treatment will be necessary.

## 2020-02-22 NOTE — Progress Notes (Signed)
    Procedures performed today:    None.  Independent interpretation of notes and tests performed by another provider:   None.  Brief History, Exam, Impression, and Recommendations:    Corn of left foot Normal returns, I performed a surgical excision of 2 corns on the plantar aspect of her left foot 2 weeks ago, she returns today doing very well, the pain has improved considerably, she has a bit of incisional pain but the pain from the corns themselves is gone. Today I remove the sutures and applied some Dermabond to reinforce the incision, return as needed for this.  Ingrown toenail of right foot Shanelle also had a right lateral great nail plate excision with phenol matricectomy, she continues to do extremely well, her nail looks great.  This was approximately 6 weeks ago. She is currently using Jublia, I think she can do this for another month but no further treatment will be necessary.    ___________________________________________ Ihor Austin. Benjamin Stain, M.D., ABFM., CAQSM. Primary Care and Sports Medicine [place] [facility]  Adjunct Instructor of Family Medicine  [employer]

## 2020-02-22 NOTE — Assessment & Plan Note (Addendum)
Normal returns, I performed a surgical excision of 2 corns on the plantar aspect of her left foot 2 weeks ago, she returns today doing very well, the pain has improved considerably, she has a bit of incisional pain but the pain from the corns themselves is gone. Today I remove the sutures and applied some Dermabond to reinforce the incision, return as needed for this.

## 2022-01-23 ENCOUNTER — Ambulatory Visit (INDEPENDENT_AMBULATORY_CARE_PROVIDER_SITE_OTHER): Payer: Medicare Other | Admitting: Podiatry

## 2022-01-23 ENCOUNTER — Encounter: Payer: Self-pay | Admitting: Podiatry

## 2022-01-23 DIAGNOSIS — Q828 Other specified congenital malformations of skin: Secondary | ICD-10-CM | POA: Diagnosis not present

## 2022-01-23 DIAGNOSIS — L84 Corns and callosities: Secondary | ICD-10-CM | POA: Diagnosis not present

## 2022-01-23 DIAGNOSIS — M7752 Other enthesopathy of left foot: Secondary | ICD-10-CM | POA: Diagnosis not present

## 2022-01-23 MED ORDER — TRIAMCINOLONE ACETONIDE 10 MG/ML IJ SUSP
10.0000 mg | Freq: Once | INTRAMUSCULAR | Status: AC
  Administered 2022-01-23: 10 mg

## 2022-01-23 NOTE — Progress Notes (Signed)
Subjective:  ? ?Patient ID: Kayla Peters, female   DOB: 76 y.o.   MRN: [id number]  ? ?HPI ?Patient presents with chronic lesions underneath the feet bilateral that are painful when pressed and feels like she is walking on bone with inflammation fluid around the plantar aspect of the left fifth metatarsal.  Patient states they have been present for around 6 months and she is tried shoe gear modifications and smoothing them and does not smoke likes to be active ? ? ?Review of Systems  ?All other systems reviewed and are negative. ? ? ?   ?Objective:  ?Physical Exam ?Vitals and nursing note reviewed.  ?Constitutional:   ?   Appearance: She is well-developed.  ?Pulmonary:  ?   Effort: Pulmonary effort is normal.  ?Musculoskeletal:     ?   General: Normal range of motion.  ?Skin: ?   General: Skin is warm.  ?Neurological:  ?   Mental Status: She is alert.  ?  ?Neurovascular status intact muscle strength found to be adequate range of motion adequate.  Patient is found to have keratotic lesion on the plantar aspect of both feet around the fifth metatarsal and there is several lesions associated with both.  There is 1 lesion on the right that has fluid buildup in the submetatarsal area and it is painful when pressed with good digital perfusion noted well oriented ? ?   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 ?   ?Plan:  ?H&P reviewed all conditions and at this point for the left when I did do sterile prep and I injected the fifth MPJ plantar 3 mg dexamethasone Kenalog 5 mg Xylocaine I then did separate debridement of lesions on both feet with no angiogenic bleeding and reappoint as needed and we may require more aggressive bone resection at 1 point in future signed this ?   ? ? ?

## 2022-02-08 IMAGING — DX DG ANKLE COMPLETE 3+V*L*
3 series · 3 of 3 positions shown · non-contrast
Comparison: None.

CLINICAL DATA: Chronic left ankle pain at the talar dome. No known
injury.

EXAM:
LEFT ANKLE COMPLETE - 3+ VIEW

[ankle ap]
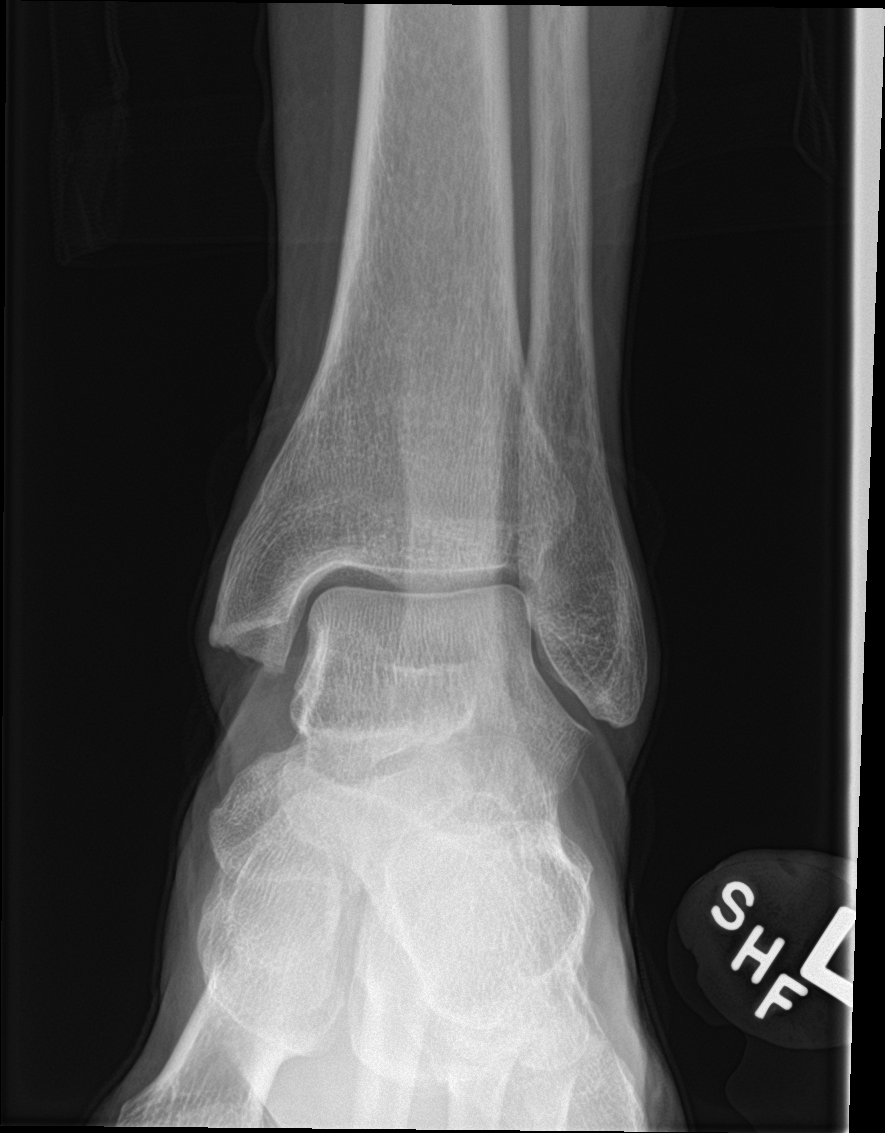

[ankle obl]
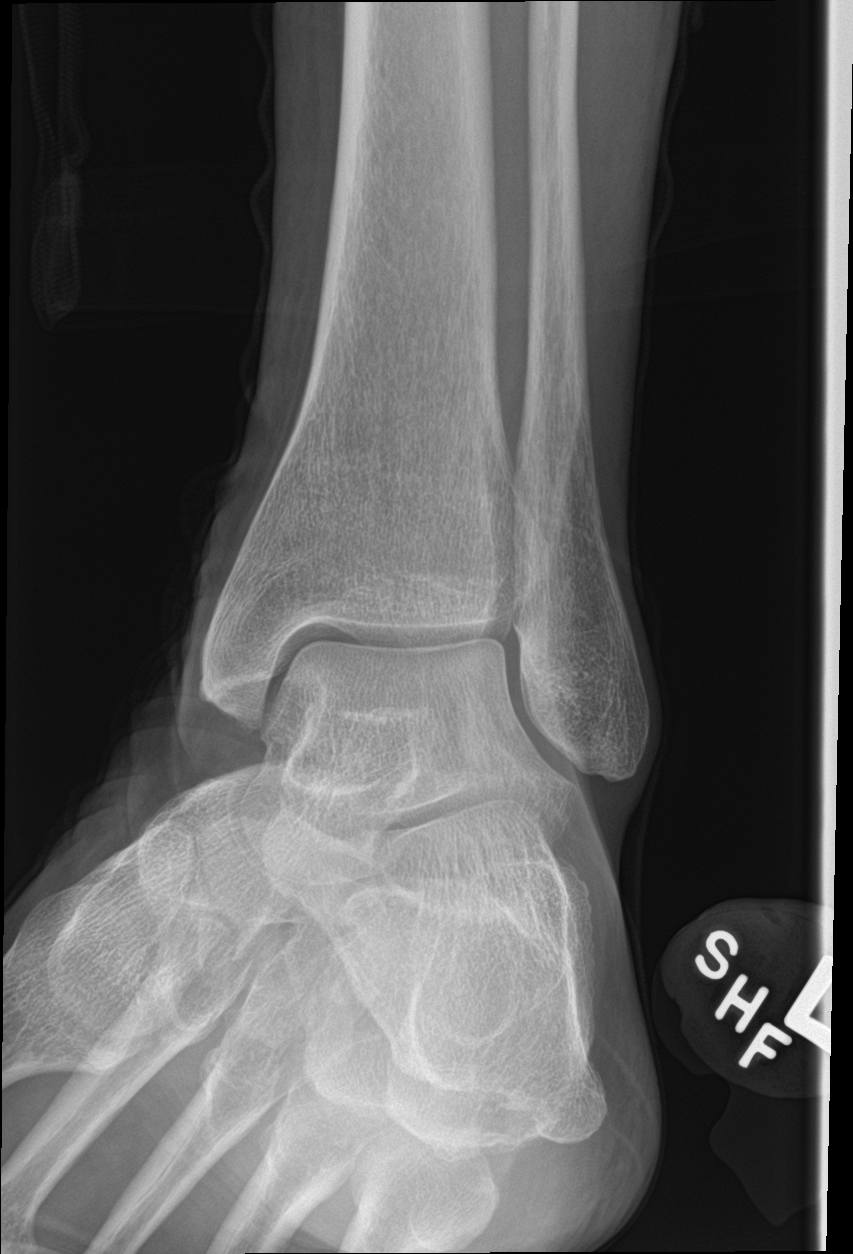

[ankle lat]
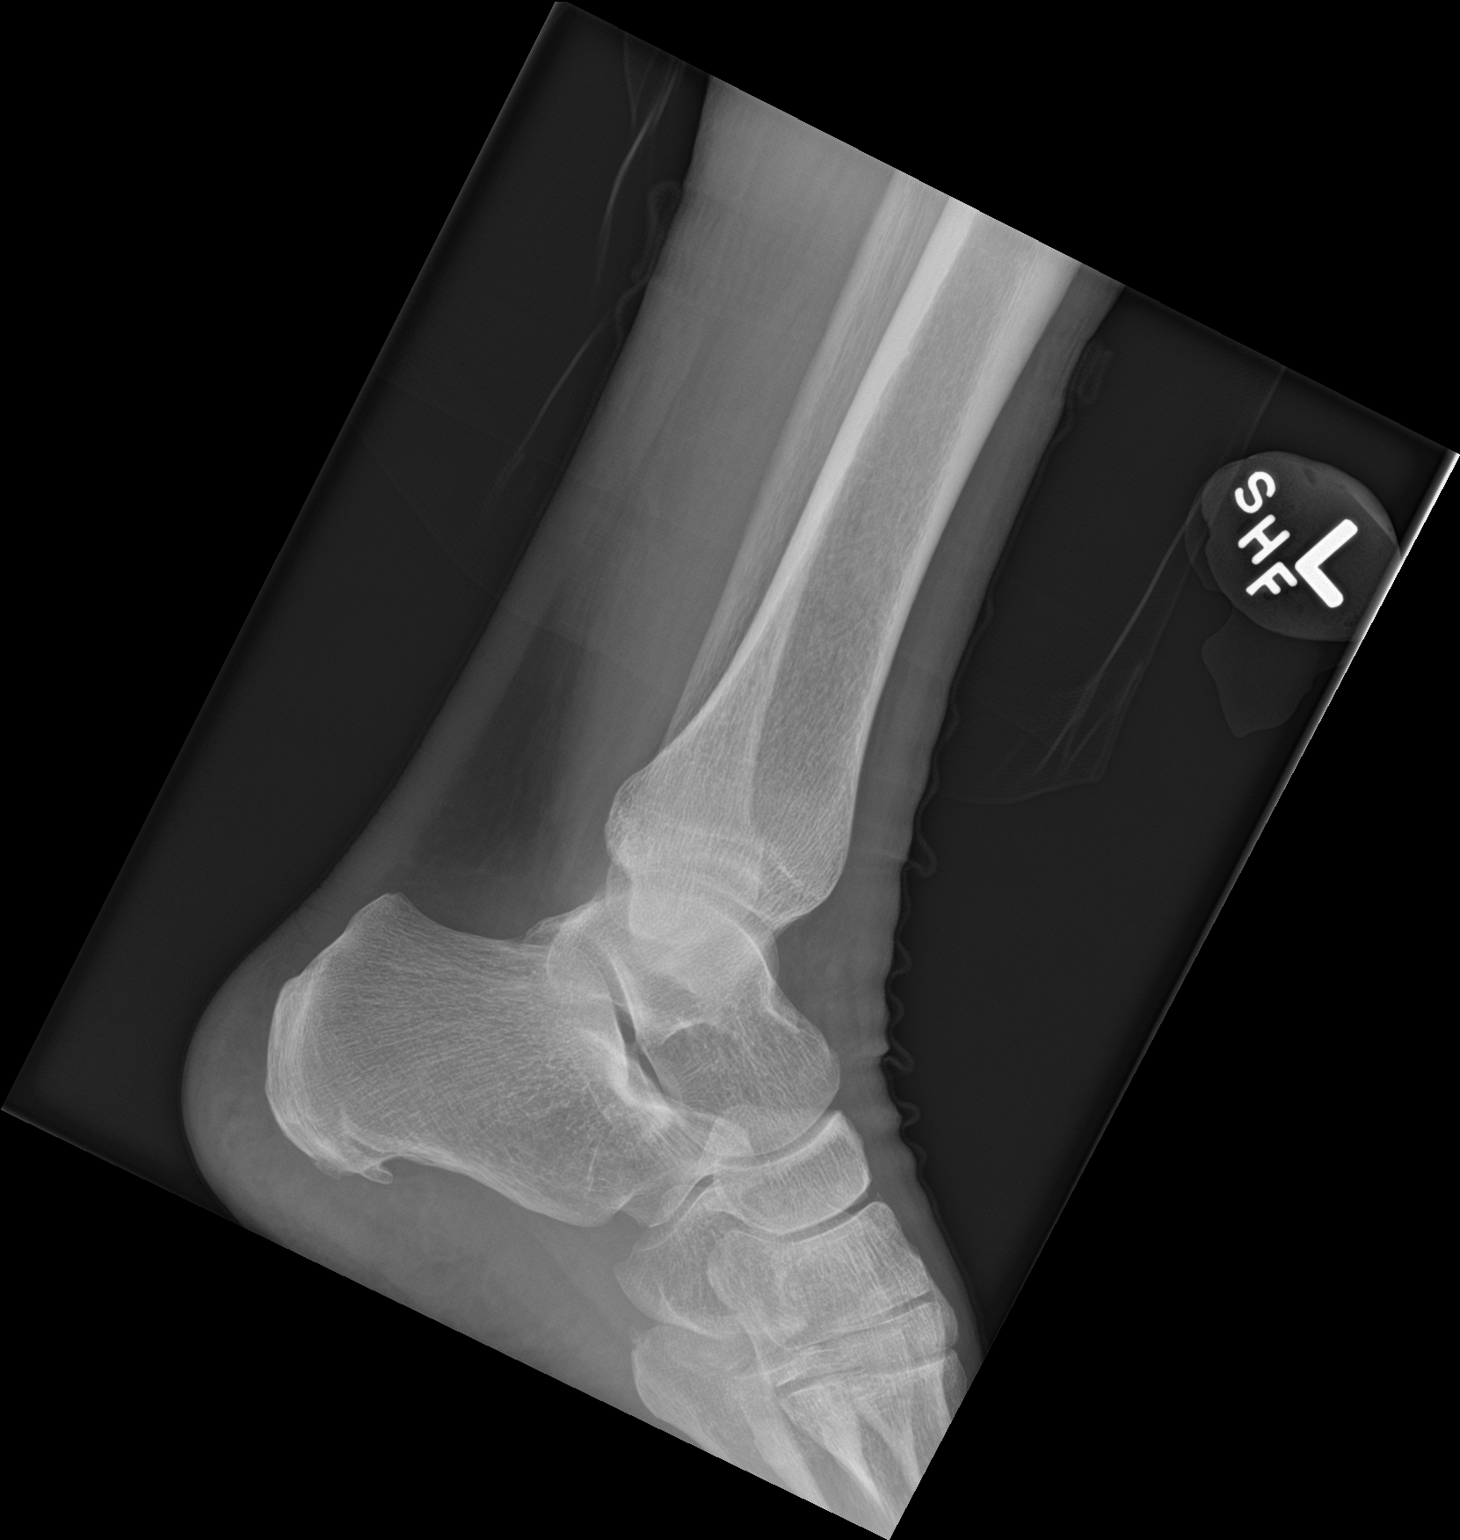

[3 of 3 positions shown; findings below may reference images not displayed]

FINDINGS: There is no evidence of fracture, dislocation, or joint effusion.
There is no evidence of arthropathy or other focal bone abnormality.
Small plantar calcaneal spur noted. Soft tissues are unremarkable.
IMPRESSION: Small plantar calcaneal spur.  Otherwise negative.

## 2022-08-18 ENCOUNTER — Ambulatory Visit (INDEPENDENT_AMBULATORY_CARE_PROVIDER_SITE_OTHER): Payer: Medicare Other | Admitting: Podiatry

## 2022-08-18 ENCOUNTER — Encounter: Payer: Self-pay | Admitting: Podiatry

## 2022-08-18 DIAGNOSIS — Q828 Other specified congenital malformations of skin: Secondary | ICD-10-CM

## 2022-08-18 NOTE — Progress Notes (Signed)
Subjective:   Patient ID: Kayla Peters, female   DOB: 76 y.o.   MRN: [id number]   HPI Patient presents with chronic lesions on the bottom of both fifth metatarsals that become painful   ROS      Objective:  Physical Exam  Neurovascular status intact with keratotic lesions of the fifth metatarsal head bilateral painful     Assessment:  Chronic lesion with pain fifth metatarsal both feet     Plan:  Discussed and at this point debrided lesions no iatrogenic bleeding reappoint for routine care May require met head resection in future

## 2023-02-09 ENCOUNTER — Encounter: Payer: Self-pay | Admitting: Podiatry

## 2023-02-09 ENCOUNTER — Ambulatory Visit (INDEPENDENT_AMBULATORY_CARE_PROVIDER_SITE_OTHER): Payer: Medicare Other | Admitting: Podiatry

## 2023-02-09 DIAGNOSIS — M21622 Bunionette of left foot: Secondary | ICD-10-CM

## 2023-02-09 DIAGNOSIS — M21621 Bunionette of right foot: Secondary | ICD-10-CM

## 2023-02-09 DIAGNOSIS — M7752 Other enthesopathy of left foot: Secondary | ICD-10-CM

## 2023-02-09 NOTE — Progress Notes (Signed)
Patient states subjective:   Patient ID: Kayla Peters, female   DOB: 77 y.o.   MRN: [id number]   HPI Has had chronic pain around the fifth metatarsal head of both feet wanted to check neurovascular   ROS      Objective:  Physical Exam  Neurovascular status intact with patient found to have prominence of the fifth metatarsal head bilaterally with keratotic tissue that forms and is chronic in nature.  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  Reappoint as symptoms indicate

## 2023-08-03 ENCOUNTER — Ambulatory Visit (INDEPENDENT_AMBULATORY_CARE_PROVIDER_SITE_OTHER): Payer: Medicare Other | Admitting: Podiatry

## 2023-08-03 ENCOUNTER — Encounter: Payer: Self-pay | Admitting: Podiatry

## 2023-08-03 DIAGNOSIS — M21621 Bunionette of right foot: Secondary | ICD-10-CM | POA: Diagnosis not present

## 2023-08-03 DIAGNOSIS — M7752 Other enthesopathy of left foot: Secondary | ICD-10-CM

## 2023-08-03 DIAGNOSIS — M21622 Bunionette of left foot: Secondary | ICD-10-CM | POA: Diagnosis not present

## 2023-08-03 DIAGNOSIS — Q828 Other specified congenital malformations of skin: Secondary | ICD-10-CM

## 2023-08-04 NOTE — Progress Notes (Signed)
Subjective:   Patient ID: Kayla Peters, female   DOB: 77 y.o.   MRN: [id number]   HPI Patient presents stating that she still has problems around the fifth metatarsals both feet and is interested in if there is anything else we can do stating she did get relief for a period of time after last treatment   ROS      Objective:  Physical Exam  Neurovascular status intact no other health issues noted with inflammation fluid around the fifth metatarsal head of both feet moderately tender when pressed and keratotic tissue formation that also has occurred associated with this.  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  At this point organ to continue conservative and courtesy trimming accomplished padding wider shoes and thick soles recommended.  Patient will be seen back to recheck with all questions answered today

## 2024-06-08 ENCOUNTER — Ambulatory Visit (INDEPENDENT_AMBULATORY_CARE_PROVIDER_SITE_OTHER): Admitting: Podiatry

## 2024-06-08 ENCOUNTER — Encounter: Payer: Self-pay | Admitting: Podiatry

## 2024-06-08 DIAGNOSIS — M7751 Other enthesopathy of right foot: Secondary | ICD-10-CM | POA: Diagnosis not present

## 2024-06-08 DIAGNOSIS — M7752 Other enthesopathy of left foot: Secondary | ICD-10-CM

## 2024-06-08 MED ORDER — TRIAMCINOLONE ACETONIDE 10 MG/ML IJ SUSP
10.0000 mg | Freq: Once | INTRAMUSCULAR | Status: AC
  Administered 2024-06-08 (×2): 10 mg via INTRA_ARTICULAR

## 2024-06-08 NOTE — Progress Notes (Signed)
 Subjective:   Patient ID: Kayla Peters, female   DOB: 78 y.o.   MRN: [id number]   HPI Patient presents with inflammation fluid around the fifth MPJ bilateral with pain   ROS      Objective:  Physical Exam  Inflammatory capsulitis fifth MPJ bilateral painful     Assessment:  Pain with fluid buildup around the joint surface     Plan:  H&P reviewed sterile prep injected the plantar capsule 3 mg dexamethasone  Kenalog  5 mg Xylocaine  bilateral debrided lesions courtesy reappoint as symptoms indicate
